# Patient Record
Sex: Female | Born: 2000 | Race: White | Hispanic: No | Marital: Single | Smoking: Never smoker
Health system: Southern US, Community
[De-identification: ages and names within clinical notes are randomized; demographics above are authoritative.]

---
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
## 2022-05-11 DIAGNOSIS — N898 Other specified noninflammatory disorders of vagina: Secondary | ICD-10-CM | POA: Diagnosis not present

## 2022-09-06 DIAGNOSIS — H6691 Otitis media, unspecified, right ear: Secondary | ICD-10-CM | POA: Diagnosis not present

## 2022-09-06 DIAGNOSIS — R0602 Shortness of breath: Secondary | ICD-10-CM | POA: Diagnosis not present

## 2022-09-06 DIAGNOSIS — R6889 Other general symptoms and signs: Secondary | ICD-10-CM | POA: Diagnosis not present

## 2022-09-06 DIAGNOSIS — Z1152 Encounter for screening for COVID-19: Secondary | ICD-10-CM | POA: Diagnosis not present

## 2022-09-06 DIAGNOSIS — J069 Acute upper respiratory infection, unspecified: Secondary | ICD-10-CM | POA: Diagnosis not present

## 2022-09-08 DIAGNOSIS — R059 Cough, unspecified: Secondary | ICD-10-CM | POA: Diagnosis not present

## 2022-09-08 DIAGNOSIS — J018 Other acute sinusitis: Secondary | ICD-10-CM | POA: Diagnosis not present

## 2022-09-08 DIAGNOSIS — R030 Elevated blood-pressure reading, without diagnosis of hypertension: Secondary | ICD-10-CM | POA: Diagnosis not present

## 2022-09-08 DIAGNOSIS — Z681 Body mass index (BMI) 19 or less, adult: Secondary | ICD-10-CM | POA: Diagnosis not present

## 2022-09-19 DIAGNOSIS — R059 Cough, unspecified: Secondary | ICD-10-CM | POA: Diagnosis not present

## 2022-09-19 DIAGNOSIS — R030 Elevated blood-pressure reading, without diagnosis of hypertension: Secondary | ICD-10-CM | POA: Diagnosis not present

## 2022-09-19 DIAGNOSIS — J40 Bronchitis, not specified as acute or chronic: Secondary | ICD-10-CM | POA: Diagnosis not present

## 2022-09-19 DIAGNOSIS — Z681 Body mass index (BMI) 19 or less, adult: Secondary | ICD-10-CM | POA: Diagnosis not present

## 2023-04-05 DIAGNOSIS — N3001 Acute cystitis with hematuria: Secondary | ICD-10-CM | POA: Diagnosis not present

## 2023-04-05 DIAGNOSIS — N390 Urinary tract infection, site not specified: Secondary | ICD-10-CM | POA: Diagnosis not present

## 2023-04-05 DIAGNOSIS — J029 Acute pharyngitis, unspecified: Secondary | ICD-10-CM | POA: Diagnosis not present

## 2023-08-16 DIAGNOSIS — R350 Frequency of micturition: Secondary | ICD-10-CM | POA: Diagnosis not present

## 2023-08-16 DIAGNOSIS — N390 Urinary tract infection, site not specified: Secondary | ICD-10-CM | POA: Diagnosis not present

## 2023-10-04 DIAGNOSIS — Z30011 Encounter for initial prescription of contraceptive pills: Secondary | ICD-10-CM | POA: Diagnosis not present

## 2024-05-18 ENCOUNTER — Emergency Department (HOSPITAL_COMMUNITY)

## 2024-05-18 ENCOUNTER — Other Ambulatory Visit: Payer: Self-pay

## 2024-05-18 ENCOUNTER — Emergency Department (HOSPITAL_COMMUNITY)
Admission: EM | Admit: 2024-05-18 | Discharge: 2024-05-18 | Disposition: A | Discharge status: Home / Self Care | Attending: Emergency Medicine | Admitting: Emergency Medicine

## 2024-05-18 DIAGNOSIS — R1012 Left upper quadrant pain: Secondary | ICD-10-CM | POA: Diagnosis not present

## 2024-05-18 DIAGNOSIS — B9689 Other specified bacterial agents as the cause of diseases classified elsewhere: Secondary | ICD-10-CM

## 2024-05-18 DIAGNOSIS — R109 Unspecified abdominal pain: Secondary | ICD-10-CM | POA: Diagnosis not present

## 2024-05-18 DIAGNOSIS — R55 Syncope and collapse: Secondary | ICD-10-CM | POA: Diagnosis not present

## 2024-05-18 DIAGNOSIS — B3731 Acute candidiasis of vulva and vagina: Secondary | ICD-10-CM | POA: Insufficient documentation

## 2024-05-18 DIAGNOSIS — N760 Acute vaginitis: Secondary | ICD-10-CM | POA: Diagnosis not present

## 2024-05-18 DIAGNOSIS — R1031 Right lower quadrant pain: Secondary | ICD-10-CM | POA: Diagnosis not present

## 2024-05-18 DIAGNOSIS — R0602 Shortness of breath: Secondary | ICD-10-CM | POA: Diagnosis not present

## 2024-05-18 DIAGNOSIS — R1032 Left lower quadrant pain: Secondary | ICD-10-CM | POA: Diagnosis not present

## 2024-05-18 LAB — WET PREP, GENITAL
Sperm: NONE SEEN
Trich, Wet Prep: NONE SEEN
WBC, Wet Prep HPF POC: >=10 — AB (ref <10)

## 2024-05-18 LAB — CBC
HCT: 41.2 % (ref 36.0–46.0)
Hemoglobin: 13.6 g/dL (ref 12.0–15.0)
MCH: 30.2 pg (ref 26.0–34.0)
MCHC: 33 g/dL (ref 30.0–36.0)
MCV: 91.4 fL (ref 80.0–100.0)
Platelets: 426 K/uL — ABNORMAL HIGH (ref 150–400)
RBC: 4.51 MIL/uL (ref 3.87–5.11)
RDW: 13.6 % (ref 11.5–15.5)
WBC: 8.8 K/uL (ref 4.0–10.5)
nRBC: 0 % (ref 0.0–0.2)

## 2024-05-18 LAB — RAPID HIV SCREEN (HIV 1/2 AB+AG)
HIV 1/2 Antibodies: NONREACTIVE
HIV-1 P24 Antigen - HIV24: NONREACTIVE

## 2024-05-18 LAB — COMPREHENSIVE METABOLIC PANEL WITH GFR
ALT: 16 U/L (ref 0–44)
AST: 21 U/L (ref 15–41)
Albumin: 4.2 g/dL (ref 3.5–5.0)
Alkaline Phosphatase: 49 U/L (ref 38–126)
Anion gap: 10 (ref 5–15)
BUN: 11 mg/dL (ref 6–20)
CO2: 21 mmol/L — ABNORMAL LOW (ref 22–32)
Calcium: 9.7 mg/dL (ref 8.9–10.3)
Chloride: 109 mmol/L (ref 98–111)
Creatinine, Ser: 0.89 mg/dL (ref 0.44–1.00)
GFR, Estimated: >60 mL/min (ref >60)
Glucose, Bld: 117 mg/dL — ABNORMAL HIGH (ref 70–99)
Potassium: 3.9 mmol/L (ref 3.5–5.1)
Sodium: 140 mmol/L (ref 135–145)
Total Bilirubin: 0.7 mg/dL (ref 0.0–1.2)
Total Protein: 7.3 g/dL (ref 6.5–8.1)

## 2024-05-18 LAB — LIPASE, BLOOD: Lipase: 31 U/L (ref 11–51)

## 2024-05-18 LAB — URINALYSIS, ROUTINE W REFLEX MICROSCOPIC
Bilirubin Urine: NEGATIVE
Glucose, UA: NEGATIVE mg/dL
Hgb urine dipstick: NEGATIVE
Ketones, ur: NEGATIVE mg/dL
Nitrite: NEGATIVE
Protein, ur: NEGATIVE mg/dL
Specific Gravity, Urine: 1.024 (ref 1.005–1.030)
pH: 6 (ref 5.0–8.0)

## 2024-05-18 LAB — HCG, SERUM, QUALITATIVE: Preg, Serum: NEGATIVE

## 2024-05-18 MED ORDER — METRONIDAZOLE 500 MG PO TABS
500.0000 mg | ORAL_TABLET | Freq: Two times a day (BID) | ORAL | 0 refills | Status: AC
Start: 2024-05-18 — End: ?

## 2024-05-18 MED ORDER — FLUCONAZOLE 150 MG PO TABS: 150.0000 mg | ORAL_TABLET | Freq: Every day | ORAL | 0 refills | Status: AC

## 2024-05-18 NOTE — Discharge Instructions (Signed)
 You were seen for your yeast infection and bacterial vaginosis in the emergency department.   At home, please take the antibiotics we have prescribed you.  Do not drink alcohol with the Flagyl .    Check your MyChart online for the results of any tests that had not resulted by the time you left the emergency department.   Follow-up with your primary doctor in 2-3 days regarding your visit.    Return immediately to the emergency department if you experience any of the following: Worsening pain, fever, or any other concerning symptoms.    Thank you for visiting our Emergency Department. It was a pleasure taking care of you today.

## 2024-05-18 NOTE — ED Triage Notes (Signed)
 [facility] from [facility] for concerning [facility] after having c/o dizziness this AM. Pt reports she has had abdominal pain and vomiting for 3 days, and then went to work today and began to feel dizzy. Pt also states she has not had her period since June, but has had 4 negative pregnancy tests.

## 2024-05-18 NOTE — ED Provider Notes (Signed)
 [place] EMERGENCY DEPARTMENT AT [facility] Provider Note   CSN: [id number] Arrival date & time: 05/18/24  1442     Patient presents with: Abdominal Pain   Kimberly Delacruz is a 23 y.o. female.  {Add pertinent medical, surgical, social history, OB history to HPI:2412} 23 year old female who presents to the emergency department with lower abdominal pain.  Patient reports that for the past 3 days she has had left lower quadrant abdominal pain.  Is intermittent.  Cramping sensation.  Has missed her period but has had negative pregnancy test at home.  Says that her pain currently is mild.  Did have episode of nausea and vomiting.  Last bowel movement was on Friday.  Says she is having some vaginal discharge as well and is sexually active.  No dysuria or frequency.  Went to urgent care and had an EKG that was abnormal and referred to us .  Denies any chest pain or shortness of breath       Prior to Admission medications   Medication Sig Start Date End Date Taking? Authorizing Provider  fluticasone  (FLONASE ) 50 MCG/ACT nasal spray Place 1 spray into both nostrils 2 (two) times daily. Patient not taking: Reported on 08/18/2020 12/26/15 08/19/20  Verdon Darnel, NP    Allergies: Patient has no known allergies.    Review of Systems  Updated Vital Signs BP 135/76   Pulse 80   Temp 98.8 F (37.1 C) (Oral)   Resp 18   SpO2 100%   Physical Exam Vitals and nursing note reviewed.  Constitutional:      General: She is not in acute distress.    Appearance: She is well-developed.  HENT:     Head: Normocephalic and atraumatic.     Right Ear: External ear normal.     Left Ear: External ear normal.     Nose: Nose normal.  Eyes:     Extraocular Movements: Extraocular movements intact.     Conjunctiva/sclera: Conjunctivae normal.     Pupils: Pupils are equal, round, and reactive to light.  Abdominal:     General: Abdomen is flat. There is no distension.     Palpations:  Abdomen is soft. There is no mass.     Tenderness: There is abdominal tenderness (Left lower quadrant). There is no right CVA tenderness, left CVA tenderness or guarding.  Musculoskeletal:     Cervical back: Normal range of motion and neck supple.     Right lower leg: No edema.     Left lower leg: No edema.  Skin:    General: Skin is warm and dry.  Neurological:     Mental Status: She is alert and oriented to person, place, and time. Mental status is at baseline.  Psychiatric:        Mood and Affect: Mood normal.     (all labs ordered are listed, but only abnormal results are displayed) Labs Reviewed  COMPREHENSIVE METABOLIC PANEL WITH GFR - Abnormal; Notable for the following components:      Result Value   CO2 21 (*)    Glucose, Bld 117 (*)    All other components within normal limits  CBC - Abnormal; Notable for the following components:   Platelets 426 (*)    All other components within normal limits  WET PREP, GENITAL  LIPASE, BLOOD  HCG, SERUM, QUALITATIVE  URINALYSIS, ROUTINE W REFLEX MICROSCOPIC  RAPID HIV SCREEN (HIV 1/2 AB+AG)  RPR  GC/CHLAMYDIA PROBE AMP ([place]) NOT AT [facility]  EKG: EKG Interpretation Date/Time:  Monday May 18 2024 15:12:49 EDT Ventricular Rate:  72 PR Interval:  109 QRS Duration:  91 QT Interval:  374 QTC Calculation: 410 R Axis:   76  Text Interpretation: Sinus rhythm Short PR interval Nonspecific T abnrm, anterolateral leads Confirmed by Yolande Charleston [phone]) on 05/18/2024 6:10:04 PM  Radiology: No results found.  {Document cardiac monitor, telemetry assessment procedure when appropriate:32947} .Pelvic exam  Date/Time: 05/18/2024 7:58 PM  Performed by: Yolande Charleston BROCKS, MD Authorized by: Yolande Charleston BROCKS, MD  Consent: Verbal consent obtained Comments: Chaperoned by RN Marilyn.  External genitalia unremarkable. Nor rashes or lesions noted.  Speculum exam with whitish vaginal discharge.  Vaginal wall mucosa is  unremarkable.  Cervix visualized and is unremarkable (closed in appearance without any protruding material).  Bimanual exam without cervical motion tenderness, adnexal tenderness or any masses appreciated.       Medications Ordered in the ED - No data to display    {Click here for ABCD2, HEART and other calculators REFRESH Note before signing:1}                              Medical Decision Making Amount and/or Complexity of Data Reviewed Labs: ordered. Radiology: ordered.   ***  {Document critical care time when appropriate  Document review of labs and clinical decision tools ie CHADS2VASC2, etc  Document your independent review of radiology images and any outside records  Document your discussion with family members, caretakers and with consultants  Document social determinants of health affecting pt's care  Document your decision making why or why not admission, treatments were needed:32947:::1}   Final diagnoses:  None    ED Discharge Orders     None

## 2024-05-18 NOTE — ED Notes (Signed)
 [place] at bedside

## 2024-05-19 ENCOUNTER — Telehealth (HOSPITAL_BASED_OUTPATIENT_CLINIC_OR_DEPARTMENT_OTHER): Payer: Self-pay | Admitting: Emergency Medicine

## 2024-05-19 LAB — GC/CHLAMYDIA PROBE AMP (~~LOC~~) NOT AT ARMC
Chlamydia: POSITIVE — AB
Comment: NEGATIVE
Comment: NORMAL
Neisseria Gonorrhea: NEGATIVE

## 2024-05-19 LAB — RPR: RPR Ser Ql: NONREACTIVE

## 2024-05-19 MED ORDER — DOXYCYCLINE HYCLATE 100 MG PO CAPS: 100.0000 mg | ORAL_CAPSULE | Freq: Two times a day (BID) | ORAL | 0 refills | Status: AC

## 2024-05-19 MED ORDER — METRONIDAZOLE 500 MG PO TABS: 500.0000 mg | ORAL_TABLET | Freq: Two times a day (BID) | ORAL | 0 refills | Status: AC

## 2024-05-19 NOTE — Telephone Encounter (Signed)
 Patient called back. Informed of positive chlamydia results. Still having LLQ pain. Will give her doxy and flagyl  to treat in case of PID.  Instructed to go to the emergency department or urgent care to get IM ceftriaxone  as well.  Instructed to refrain from sex until she completes her treatment course and have any partners treated.

## 2024-05-19 NOTE — Telephone Encounter (Signed)
 Attempted to contact patient about positive chlamydia test.  Unable to reach the patient.  Left HIPAA compliant voicemail to call back for the results of testing that was performed yesterday.

## 2024-05-20 ENCOUNTER — Ambulatory Visit (HOSPITAL_COMMUNITY): Payer: Self-pay

## 2024-05-20 DIAGNOSIS — N926 Irregular menstruation, unspecified: Secondary | ICD-10-CM | POA: Diagnosis not present

## 2024-05-20 DIAGNOSIS — B3731 Acute candidiasis of vulva and vagina: Secondary | ICD-10-CM | POA: Diagnosis not present

## 2024-05-20 DIAGNOSIS — A749 Chlamydial infection, unspecified: Secondary | ICD-10-CM | POA: Diagnosis not present

## 2024-05-20 NOTE — Progress Notes (Signed)
 [address] - AMBULATORY [facility]  - URGENT [facility] [address] [place] [id number]  History of Present Illness   History given by patient Accompanied by:   self/  family member/friend who is helping with history  Patient ID: Kimberly Delacruz is a 23 y.o. lady presenting for sexually transmitted disease check.  Sexual history reviewed with the patient.   Pt reports going to [facility] on 05/18/24 and tested positive for chlamydia. Pt was given rx for doxycycline  and presents today for Rocephin  injection. She was also informed that she has a yeast infection and BV and was given fluconazole  and metronidazole .   STD exposure:  yes Monogamous yes Condom use occasionally current sexual partner thought to have history of unknown, will test today Previous history of STD: none  Current symptoms include:  No vaginal discharge described as thick and white No vaginal sores or lesion No dysuria No pelvic pain  No abnormal bleeding No fever No nausea No vomiting No skin lesions/rash in perineal region No oral lesions or sore throat  Denies fever, chills, night sweats, chest pain, palpitations, irregular heartbeat, shortness of breath, or GI symptoms such as nausea, vomiting, or diarrhea.   Patient has no co-morbidities  or medications that might increase the risk for developing a severe infection   Review of Systems   Review of Systems   Negative except per HPI noted above.   Past Medical History, Past surgery History, Allergies, Social history, and Family History were reviewed and updated.   Physicial Exam   Vitals:   05/20/24 1025  BP: 108/72  Pulse: 70  Resp: 18  Temp: 98.7 F (37.1 C)  SpO2: 100%     Physical Exam   Constitutional:  Well-nourished well-developed in no apparent distress Neuro:   Alert and oriented x3 Psych:   Affect is normal Head:    Normocephalic, atraumatic Neck:    Full range of motion, no  lymphadenopathy CV:    Regular rate and rhythm without murmurs rubs or gallops RESP:   Clear to auscultation bilaterally without wheezes rales or rhonchi Skin:    No focal rashes, lesions, or ulcerations    Recent Results (from the past 24 hours)  POC HCG Qualitative, Urine   Collection Time: 05/20/24 11:04 AM  Result Value Ref Range   HCG, Urine, POC Negative Negative   Internal Control Acceptable    Kit/Device Lot # [id number]    Kit/Device Expiration Date 09/20/2025       Diagnosis   1. Chlamydia (Primary) - cefTRIAXone  (ROCEPHIN ) injection 500 mg - lidocaine  (XYLOCAINE ) 10 mg/mL (1 %) injection 10 mg  2. Candidal vaginitis - POC HCG Qualitative, Urine - fluconazole  (DIFLUCAN ) 150 mg tablet; Take 1 pill at the first sign of a yeast infection, may repeat in 48 hours if needed  Dispense: 2 tablet; Refill: 0  3. Missed period - POC HCG Qualitative, Urine    Medical Decision Making [name] is a 23 y.o. female who presents to urgent care today with complaints of No chief complaint on file.  On exam, VS stable and patient is afebrile and in no acute distress. Appears well-hydrated and capillary refill <2 seconds.   STD testing performed at recent ED visit. Pt tested positive for chlamydia, BV, and candida. Rx for fluconazole , metronidazole , and doxy sent to her pharmacy by ED provider. She was advised to present to ED for Rocephin  INJ. Rocephin  INJ given. Rx also sent  for additional fluconazole  to have on hand if one dose is not adequate. Advised OTC probiotics. Pt notified to contact partner(s) for testing and treatment, if indicated. Recommended protection to prevent STI and gave pt handout.    Pt reports missed period. UA hcg negative.    Discharge Information  Symptomatic management discussed.  Patient was given verbal and written instructions on symptoms that necessitate return to the UC/ED, and instructed to f/u w/ UC or PCP if not improving in expected timeframe.    2. Patient/parent has been instructed on RX/OTC medications, dosages, side effects, and possible interactions as associated with each diagnosis in my impression and plan  above.   3. Patient education (verbal/handout) given on diagnosis, pathophysiology, treatment of diagnosis, side effects of medication use for treatment, restrictions while taking medication, supportives measures such as staying hydrated.   4. Red Flags associated with diagnosis/es were reviewed and patient instructed on action plan if red flags develop. They have been instructed that if symptoms worsen or red flags develop they should return to Urgent Care, go to the nearest ED, or activate EMS/911.     5. Patient and/or parent/guardian (if applicable) agreed with plan and voiced understanding.  No barriers to adherence perceived by myself.  Portions of this note may have been dictated using Dragon dictation software/hardware and may contain grammatical or spelling errors.    If a new prescription was given today, then I discussed potential side effects, drug interactions, instructions for taking the medication, and the consequences of not taking it.    F/u: Follow up closely with primary care provider (PCP) and other specialists for further care and routine care, but seek medical attention sooner if worsening/concerning signs or symptoms.  Follow up with PCP   Electronically signed by: Elvira Dublin, PA-C 05/20/2024 11:09 AM
